# Patient Record
Sex: Female | Born: 1995 | Race: White | Hispanic: No | Marital: Single | Smoking: Never smoker
Health system: Southern US, Community
[De-identification: ages and names within clinical notes are randomized; demographics above are authoritative.]

## PROBLEM LIST (undated history)

## (undated) DIAGNOSIS — R519 Headache, unspecified: Secondary | ICD-10-CM

## (undated) DIAGNOSIS — E039 Hypothyroidism, unspecified: Secondary | ICD-10-CM

## (undated) DIAGNOSIS — G43109 Migraine with aura, not intractable, without status migrainosus: Secondary | ICD-10-CM

## (undated) DIAGNOSIS — R51 Headache: Secondary | ICD-10-CM

## (undated) HISTORY — DX: Migraine with aura, not intractable, without status migrainosus: G43.109

## (undated) HISTORY — DX: Hypothyroidism, unspecified: E03.9

## (undated) HISTORY — DX: Headache: R51

## (undated) HISTORY — PX: WISDOM TOOTH EXTRACTION: SHX21

## (undated) HISTORY — DX: Headache, unspecified: R51.9

---
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
## 2014-08-26 ENCOUNTER — Telehealth: Payer: Self-pay | Admitting: Neurology

## 2014-08-26 ENCOUNTER — Ambulatory Visit: Payer: Managed Care, Other (non HMO) | Admitting: Neurology

## 2014-08-26 NOTE — Telephone Encounter (Signed)
This patient canceled a new patient appointment within several hours of the appointment time.

## 2015-03-14 ENCOUNTER — Ambulatory Visit (INDEPENDENT_AMBULATORY_CARE_PROVIDER_SITE_OTHER): Payer: Medicaid Other | Admitting: Neurology

## 2015-03-14 ENCOUNTER — Encounter: Payer: Self-pay | Admitting: Neurology

## 2015-03-14 VITALS — BP 102/71 | HR 69 | Ht 63.0 in | Wt 142.4 lb

## 2015-03-14 DIAGNOSIS — G43109 Migraine with aura, not intractable, without status migrainosus: Secondary | ICD-10-CM | POA: Diagnosis not present

## 2015-03-14 HISTORY — DX: Migraine with aura, not intractable, without status migrainosus: G43.109

## 2015-03-14 MED ORDER — RIZATRIPTAN BENZOATE 10 MG PO TBDP
10.0000 mg | ORAL_TABLET | Freq: Three times a day (TID) | ORAL | Status: AC | PRN
Start: 2015-03-14 — End: ?

## 2015-03-14 NOTE — Patient Instructions (Signed)

## 2015-03-14 NOTE — Progress Notes (Signed)
Reason for visit: Migraine headache  Referring physician: Dr. Maryclare LabradorHodges  Brittany Shah is a 19 y.o. female  History of present illness:  Brittany Shah is a 19 year old right-handed white female with a history of migraine headaches since age 89. The patient indicates that she is having one headache every 2 or 3 months. The headache may be on the right or left temporal area, and may be preceded by a visual disturbance associated with peripheral vision blurring for about 30 minutes, then the headache will ensue. The headache may last several hours, associated with nausea and vomiting. She denies any focal numbness or weakness with the headache. She indicates that sleep will help the headache. She has tried a nasal spray of some sort in the past which was not effective. She was placed on cyproheptadine on a daily basis but this resulted in an increase in weight. She denies any particular activating factors for her headache. She comes to this office for an evaluation. She does not use caffeinated products. Her mother also has migraine.  Past Medical History  Diagnosis Date  . Headache   . Hypothyroidism   . 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 Wisdom tooth extraction      Family History  Problem Relation Age of Onset  . Kidney failure Mother   . Melanoma Mother   . Migraines Mother   . Kidney failure Sister   . Hypothyroidism Maternal Grandmother   . Heart attack Father     Social history:  reports that she has never smoked. She has never used smokeless tobacco. She reports that she does not drink alcohol or use illicit drugs.  Medications:  Prior to Admission medications   Not on File     No Known Allergies  ROS:  Out of a complete 14 system review of symptoms, the patient complains only of the following symptoms, and all other reviewed systems are negative.  Blurred vision Headache  Blood pressure 102/71, pulse  69, height 5\' 3"  (1.6 m), weight 142 lb 6.4 oz (64.592 kg).  Physical Exam  General: The patient is alert and cooperative at the time of the examination. The patient is minimally obese.  Eyes: Pupils are equal, round, and reactive to light. Discs are flat bilaterally. Venous pulsations are seen.  Neck: The neck is supple, no carotid bruits are noted.  Respiratory: The respiratory examination is clear.  Cardiovascular: The cardiovascular examination reveals a regular rate and rhythm, no obvious murmurs or rubs are noted.  Skin: Extremities are without significant edema.  Neurologic Exam  Mental status: The patient is alert and oriented x 3 at the time of the examination. The patient has apparent normal recent and remote memory, with an apparently normal attention span and concentration ability.  Cranial nerves: Facial symmetry is present. There is good sensation of the face to pinprick and soft touch bilaterally. The strength of the facial muscles and the muscles to head turning and shoulder shrug are normal bilaterally. Speech is well enunciated, no aphasia or dysarthria is noted. Extraocular movements are full. Visual fields are full. The tongue is midline, and the patient has symmetric elevation of the soft palate. No obvious hearing deficits are noted.  Motor: The motor testing reveals 5 over 5 strength of all 4 extremities. Good symmetric motor tone is noted throughout.  Sensory: Sensory testing is intact to pinprick, soft touch, vibration sensation, and position sense on all  4 extremities. No evidence of extinction is noted.  Coordination: Cerebellar testing reveals good finger-nose-finger and heel-to-shin bilaterally.  Gait and station: Gait is normal. Tandem gait is normal. Romberg is negative. No drift is seen.  Reflexes: Deep tendon reflexes are symmetric and normal bilaterally. Toes are downgoing bilaterally.   Assessment/Plan:  1. Classic migraine   The patient is  having occasional migraine headache, no indication for daily prophylactic medications. The patient will be placed on Maxalt tablets, if this is not effective, she will contact our office. She may take Advil with the Maxalt at the onset of the headache. I encouraged her to initiate treatment as early as possible in the course of the headache. The patient will follow-up in 6 months.  Marlan Palau MD 03/14/2015 8:45 AM  [facility] [address] [place], [place] [address]  Phone [phone] Fax [phone]

## 2015-09-15 ENCOUNTER — Ambulatory Visit: Payer: Medicaid Other | Admitting: Adult Health

## 2015-09-16 ENCOUNTER — Encounter: Payer: Self-pay | Admitting: Adult Health

## 2018-11-09 DIAGNOSIS — Z6824 Body mass index (BMI) 24.0-24.9, adult: Secondary | ICD-10-CM | POA: Diagnosis not present

## 2018-11-09 DIAGNOSIS — F419 Anxiety disorder, unspecified: Secondary | ICD-10-CM | POA: Diagnosis not present

## 2018-11-17 DIAGNOSIS — L02411 Cutaneous abscess of right axilla: Secondary | ICD-10-CM | POA: Diagnosis not present

## 2018-12-21 DIAGNOSIS — Z6823 Body mass index (BMI) 23.0-23.9, adult: Secondary | ICD-10-CM | POA: Diagnosis not present

## 2018-12-21 DIAGNOSIS — F419 Anxiety disorder, unspecified: Secondary | ICD-10-CM | POA: Diagnosis not present

## 2018-12-21 DIAGNOSIS — Z1331 Encounter for screening for depression: Secondary | ICD-10-CM | POA: Diagnosis not present

## 2019-01-18 DIAGNOSIS — F419 Anxiety disorder, unspecified: Secondary | ICD-10-CM | POA: Diagnosis not present

## 2019-01-18 DIAGNOSIS — Z7189 Other specified counseling: Secondary | ICD-10-CM | POA: Diagnosis not present

## 2019-01-25 DIAGNOSIS — F419 Anxiety disorder, unspecified: Secondary | ICD-10-CM | POA: Diagnosis not present

## 2019-03-01 DIAGNOSIS — Z7189 Other specified counseling: Secondary | ICD-10-CM | POA: Diagnosis not present

## 2019-03-01 DIAGNOSIS — F419 Anxiety disorder, unspecified: Secondary | ICD-10-CM | POA: Diagnosis not present

## 2019-03-01 DIAGNOSIS — R110 Nausea: Secondary | ICD-10-CM | POA: Diagnosis not present

## 2019-04-18 DIAGNOSIS — Z03818 Encounter for observation for suspected exposure to other biological agents ruled out: Secondary | ICD-10-CM | POA: Diagnosis not present

## 2019-04-18 DIAGNOSIS — R05 Cough: Secondary | ICD-10-CM | POA: Diagnosis not present

## 2019-05-20 DIAGNOSIS — M25561 Pain in right knee: Secondary | ICD-10-CM | POA: Diagnosis not present

## 2019-06-05 DIAGNOSIS — Z7189 Other specified counseling: Secondary | ICD-10-CM | POA: Diagnosis not present

## 2019-06-05 DIAGNOSIS — Z20828 Contact with and (suspected) exposure to other viral communicable diseases: Secondary | ICD-10-CM | POA: Diagnosis not present

## 2019-07-17 DIAGNOSIS — Z20828 Contact with and (suspected) exposure to other viral communicable diseases: Secondary | ICD-10-CM | POA: Diagnosis not present

## 2019-10-16 DIAGNOSIS — F419 Anxiety disorder, unspecified: Secondary | ICD-10-CM | POA: Diagnosis not present

## 2019-10-16 DIAGNOSIS — Z6823 Body mass index (BMI) 23.0-23.9, adult: Secondary | ICD-10-CM | POA: Diagnosis not present

## 2019-10-23 DIAGNOSIS — R45851 Suicidal ideations: Secondary | ICD-10-CM | POA: Diagnosis not present

## 2019-10-23 DIAGNOSIS — Z6820 Body mass index (BMI) 20.0-20.9, adult: Secondary | ICD-10-CM | POA: Diagnosis not present

## 2019-10-23 DIAGNOSIS — F419 Anxiety disorder, unspecified: Secondary | ICD-10-CM | POA: Diagnosis not present

## 2019-12-31 DIAGNOSIS — Z6821 Body mass index (BMI) 21.0-21.9, adult: Secondary | ICD-10-CM | POA: Diagnosis not present

## 2019-12-31 DIAGNOSIS — Z01419 Encounter for gynecological examination (general) (routine) without abnormal findings: Secondary | ICD-10-CM | POA: Diagnosis not present

## 2019-12-31 DIAGNOSIS — Z113 Encounter for screening for infections with a predominantly sexual mode of transmission: Secondary | ICD-10-CM | POA: Diagnosis not present
